# Patient Record
Sex: Female | Born: 1955 | Race: Black or African American | Hispanic: No | Marital: Single | Smoking: Never smoker
Health system: Southern US, Community
[De-identification: ages and names within clinical notes are randomized; demographics above are authoritative.]

## PROBLEM LIST (undated history)

## (undated) DIAGNOSIS — E119 Type 2 diabetes mellitus without complications: Secondary | ICD-10-CM

## (undated) DIAGNOSIS — K219 Gastro-esophageal reflux disease without esophagitis: Secondary | ICD-10-CM

## (undated) HISTORY — PX: ABDOMINAL HYSTERECTOMY: SHX81

---

## 2004-02-11 ENCOUNTER — Ambulatory Visit: Payer: Self-pay | Admitting: Family Medicine

## 2005-03-19 ENCOUNTER — Ambulatory Visit: Payer: Self-pay | Admitting: Family Medicine

## 2005-08-06 ENCOUNTER — Ambulatory Visit: Payer: Self-pay

## 2006-03-23 ENCOUNTER — Ambulatory Visit: Payer: Self-pay | Admitting: Family Medicine

## 2007-04-12 ENCOUNTER — Ambulatory Visit: Payer: Self-pay | Admitting: Family Medicine

## 2008-01-18 ENCOUNTER — Ambulatory Visit: Payer: Self-pay | Admitting: Unknown Physician Specialty

## 2008-05-01 ENCOUNTER — Ambulatory Visit: Payer: Self-pay | Admitting: Family Medicine

## 2009-06-11 ENCOUNTER — Ambulatory Visit: Payer: Self-pay

## 2010-06-20 ENCOUNTER — Ambulatory Visit: Payer: Self-pay | Admitting: Family Medicine

## 2010-07-04 ENCOUNTER — Ambulatory Visit: Payer: Self-pay | Admitting: Family Medicine

## 2011-02-17 ENCOUNTER — Ambulatory Visit: Payer: Self-pay | Admitting: Surgery

## 2011-06-22 ENCOUNTER — Ambulatory Visit: Payer: Self-pay | Admitting: Surgery

## 2011-12-30 ENCOUNTER — Ambulatory Visit: Payer: Self-pay | Admitting: Surgery

## 2013-01-02 ENCOUNTER — Ambulatory Visit: Payer: Self-pay | Admitting: Internal Medicine

## 2013-08-15 ENCOUNTER — Ambulatory Visit (INDEPENDENT_AMBULATORY_CARE_PROVIDER_SITE_OTHER): Payer: BC Managed Care – PPO

## 2013-08-15 ENCOUNTER — Ambulatory Visit (INDEPENDENT_AMBULATORY_CARE_PROVIDER_SITE_OTHER): Payer: BC Managed Care – PPO | Admitting: Podiatry

## 2013-08-15 ENCOUNTER — Encounter: Payer: Self-pay | Admitting: Podiatry

## 2013-08-15 VITALS — BP 157/84 | HR 73 | Resp 16 | Ht 63.0 in | Wt 153.0 lb

## 2013-08-15 DIAGNOSIS — M79609 Pain in unspecified limb: Secondary | ICD-10-CM

## 2013-08-15 DIAGNOSIS — B351 Tinea unguium: Secondary | ICD-10-CM

## 2013-08-15 DIAGNOSIS — M722 Plantar fascial fibromatosis: Secondary | ICD-10-CM

## 2013-08-15 MED ORDER — TRIAMCINOLONE ACETONIDE 10 MG/ML IJ SUSP
10.0000 mg | Freq: Once | INTRAMUSCULAR | Status: AC
  Administered 2013-08-15: 10 mg

## 2013-08-15 NOTE — Progress Notes (Signed)
Subjective:     Patient ID: Lebron Conners, female   DOB: 18-Dec-1955, 58 y.o.   MRN: [id number]  Foot Pain   patient states that she has very thick nailbeds 1-5 both feet that she cannot cut herself and she has problems with her right heel that's been very painful for the last month especially when she gets up in the morning and after periods of sitting   Review of Systems  All other systems reviewed and are negative.      Objective:   Physical Exam  Nursing note and vitals reviewed. Cardiovascular: Intact distal pulses.   Musculoskeletal: Normal range of motion.  Neurological: She is alert.  Skin: Skin is warm.   neurovascular status intact with muscle strength adequate and range of motion subtalar and midtarsal joint within normal limits. Patient has flatfoot deformity upon gait analysis and digits that are well perfused and is noted to have exquisite discomfort on the plantar aspect of the right heel. 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 Injected the right plantar fascia 3 mg Kenalog 5 mg Xylocaine Marcaine mixture and debridement nailbeds 1-5 both feet with no bleeding noted. In instructions on possible and probable orthotics for the long-term

## 2013-08-15 NOTE — Patient Instructions (Signed)

## 2013-08-15 NOTE — Progress Notes (Signed)
   Subjective:    Patient ID: Jill Heath, female    DOB: 1956-03-03, 58 y.o.   MRN: [id number]  HPI Comments: i have heel pain in my right foot and ive had it for 1 month. Its painful when i get up in the mornings. i soak my feet in epsom salt and vinegar. i want my toenails trimmed.  Foot Pain Associated symptoms include coughing.      Review of Systems  Constitutional: Positive for unexpected weight change.  HENT:       Ringing in ears  Respiratory: Positive for cough.   Gastrointestinal: Positive for constipation.  Neurological: Positive for dizziness.  Hematological: Bruises/bleeds easily.       Objective:   Physical Exam        Assessment & Plan:

## 2013-08-22 ENCOUNTER — Ambulatory Visit: Payer: BC Managed Care – PPO | Admitting: Podiatry

## 2013-09-08 ENCOUNTER — Encounter: Payer: Self-pay | Admitting: Podiatry

## 2013-09-08 ENCOUNTER — Ambulatory Visit (INDEPENDENT_AMBULATORY_CARE_PROVIDER_SITE_OTHER): Payer: BC Managed Care – PPO | Admitting: Podiatry

## 2013-09-08 DIAGNOSIS — B351 Tinea unguium: Secondary | ICD-10-CM

## 2013-09-08 DIAGNOSIS — M722 Plantar fascial fibromatosis: Secondary | ICD-10-CM

## 2013-09-08 NOTE — Progress Notes (Signed)
Subjective:     Patient ID: Jill ConnersNancy Marcus, female   DOB: 02/28/1956, 58 y.o.   MRN: [id number]  HPI patient states my heel pain is quite a bit better but I've had this problem for a long time and it seems like it comes and goes and I know my feet do not function well   Review of Systems     Objective:   Physical Exam Neurovascular status intact with diminished arch height and excessive E. version with discomfort still noted in the medial fascially band right but improved from previous visit    Assessment:     Improved plantar fasciitis in the acute nature but still has chronic condition with foot structure being a part of the problem    Plan:     H&P performed and at this time I have recommended orthotics and scanned for accomplished. He will reappoint when they are returned

## 2013-09-18 ENCOUNTER — Encounter: Payer: Self-pay | Admitting: Podiatry

## 2013-09-18 NOTE — Progress Notes (Signed)
Patients orthotics have arrived , patient has a return appointment scheduled

## 2013-09-29 ENCOUNTER — Ambulatory Visit (INDEPENDENT_AMBULATORY_CARE_PROVIDER_SITE_OTHER): Payer: BC Managed Care – PPO | Admitting: *Deleted

## 2013-09-29 DIAGNOSIS — M722 Plantar fascial fibromatosis: Secondary | ICD-10-CM

## 2013-09-29 NOTE — Progress Notes (Signed)
Pt presents for orthotic pick up written and verbal instructions are given 

## 2013-09-29 NOTE — Patient Instructions (Signed)

## 2014-02-16 ENCOUNTER — Ambulatory Visit (INDEPENDENT_AMBULATORY_CARE_PROVIDER_SITE_OTHER): Payer: BC Managed Care – PPO | Admitting: Podiatry

## 2014-02-16 DIAGNOSIS — M722 Plantar fascial fibromatosis: Secondary | ICD-10-CM

## 2014-02-16 MED ORDER — TRIAMCINOLONE ACETONIDE 10 MG/ML IJ SUSP
10.0000 mg | Freq: Once | INTRAMUSCULAR | Status: AC
  Administered 2014-02-16: 10 mg

## 2014-02-17 NOTE — Progress Notes (Signed)
Subjective:     Patient ID: Jill Heath, female   DOB: 04/29/1955, 58 y.o.   MRN: [id number]  HPI patient states my right heel has started to hurt me and I am unable to bear good weight on it and reminds me of other problems I'm   Review of Systems     Objective:   Physical Exam Neurovascular status intact with pain in the plantar aspect right heel at the insertional point of the tendon into the calcaneus with moderate depression of the arch    Assessment:     Plantar fasciitis right with inflammation at the insertion    Plan:     Injected the right plantar fascia 3 mg Kenalog 5 mg Xylocaine and dispensed fascial brace. Gave instructions on physical therapy shoe gear modification and reappoint in 10 days

## 2014-03-05 ENCOUNTER — Ambulatory Visit: Payer: Self-pay | Admitting: Internal Medicine

## 2015-05-31 ENCOUNTER — Other Ambulatory Visit: Payer: Self-pay | Admitting: Internal Medicine

## 2015-05-31 DIAGNOSIS — Z1231 Encounter for screening mammogram for malignant neoplasm of breast: Secondary | ICD-10-CM

## 2015-06-20 ENCOUNTER — Ambulatory Visit
Admission: RE | Admit: 2015-06-20 | Discharge: 2015-06-20 | Disposition: A | Discharge status: Home / Self Care | Payer: BLUE CROSS/BLUE SHIELD | Source: Ambulatory Visit | Attending: Internal Medicine | Admitting: Internal Medicine

## 2015-06-20 DIAGNOSIS — Z1231 Encounter for screening mammogram for malignant neoplasm of breast: Secondary | ICD-10-CM | POA: Diagnosis not present

## 2016-07-03 ENCOUNTER — Other Ambulatory Visit: Payer: Self-pay | Admitting: Internal Medicine

## 2016-07-24 ENCOUNTER — Other Ambulatory Visit: Payer: Self-pay | Admitting: Internal Medicine

## 2016-07-24 DIAGNOSIS — Z1231 Encounter for screening mammogram for malignant neoplasm of breast: Secondary | ICD-10-CM

## 2016-08-05 ENCOUNTER — Ambulatory Visit
Admission: RE | Admit: 2016-08-05 | Discharge: 2016-08-05 | Disposition: A | Discharge status: Home / Self Care | Payer: BLUE CROSS/BLUE SHIELD | Source: Ambulatory Visit | Attending: Internal Medicine | Admitting: Internal Medicine

## 2016-08-05 ENCOUNTER — Encounter: Payer: Self-pay | Admitting: Radiology

## 2016-08-05 DIAGNOSIS — Z1231 Encounter for screening mammogram for malignant neoplasm of breast: Secondary | ICD-10-CM | POA: Insufficient documentation

## 2017-06-21 ENCOUNTER — Other Ambulatory Visit: Payer: Self-pay | Admitting: Internal Medicine

## 2017-06-21 DIAGNOSIS — Z1231 Encounter for screening mammogram for malignant neoplasm of breast: Secondary | ICD-10-CM

## 2017-08-09 ENCOUNTER — Ambulatory Visit
Admission: RE | Admit: 2017-08-09 | Discharge: 2017-08-09 | Disposition: A | Discharge status: Home / Self Care | Payer: BLUE CROSS/BLUE SHIELD | Source: Ambulatory Visit | Attending: Internal Medicine | Admitting: Internal Medicine

## 2017-08-09 ENCOUNTER — Encounter (INDEPENDENT_AMBULATORY_CARE_PROVIDER_SITE_OTHER): Payer: Self-pay

## 2017-08-09 DIAGNOSIS — Z1231 Encounter for screening mammogram for malignant neoplasm of breast: Secondary | ICD-10-CM | POA: Diagnosis not present

## 2018-03-06 ENCOUNTER — Encounter: Payer: Self-pay | Admitting: Medical Oncology

## 2018-03-06 ENCOUNTER — Emergency Department: Payer: BLUE CROSS/BLUE SHIELD

## 2018-03-06 ENCOUNTER — Emergency Department
Admission: EM | Admit: 2018-03-06 | Discharge: 2018-03-06 | Disposition: A | Discharge status: Home / Self Care | Payer: BLUE CROSS/BLUE SHIELD | Attending: Emergency Medicine | Admitting: Emergency Medicine

## 2018-03-06 DIAGNOSIS — J209 Acute bronchitis, unspecified: Secondary | ICD-10-CM | POA: Diagnosis not present

## 2018-03-06 DIAGNOSIS — R05 Cough: Secondary | ICD-10-CM | POA: Diagnosis present

## 2018-03-06 MED ORDER — ALBUTEROL SULFATE 108 (90 BASE) MCG/ACT IN AEPB: 2.0000 | INHALATION_SPRAY | Freq: Four times a day (QID) | RESPIRATORY_TRACT | 0 refills | Status: DC

## 2018-03-06 MED ORDER — BENZONATATE 200 MG PO CAPS: 200.0000 mg | ORAL_CAPSULE | Freq: Three times a day (TID) | ORAL | 0 refills | Status: DC | PRN

## 2018-03-06 MED ORDER — METHYLPREDNISOLONE 4 MG PO TBPK: ORAL_TABLET | ORAL | 0 refills | Status: DC

## 2018-03-06 NOTE — ED Triage Notes (Signed)
Pt reports dry cough and "tickle" in her throat for over a month. Pt in NAD

## 2018-03-06 NOTE — Discharge Instructions (Addendum)
Follow-up with your regular doctor if not better in 4 to 5 days.  Return emergency department worsening.  Take medications as prescribed.

## 2018-03-06 NOTE — ED Provider Notes (Signed)
[facility] Emergency Department Provider Note  ____________________________________________   First MD Initiated Contact with Patient 03/06/18 1255     (approximate)  I have reviewed the triage vital signs and the nursing notes.   HISTORY  Chief Complaint Sore Throat and Cough    HPI Jill Heath is a 62 y.o. female presents emergency department complaining of cough and congestion with clear mucus.  Symptoms for 2 months.  Denies fever, chills, chest pain or shortness of breath.  Patient was given amoxicillin by her regular doctor which did not cure it.  She denies any chest pain or shortness of breath.   History reviewed. No pertinent past medical history.  There are no active problems to display for this patient.   Past Surgical History:  Procedure Laterality Date  . ABDOMINAL HYSTERECTOMY      Prior to Admission medications   Medication Sig Start Date End Date Taking? Authorizing Provider  Albuterol Sulfate (PROAIR RESPICLICK) 108 (90 Base) MCG/ACT AEPB Inhale 2 Inhalers into the lungs every 6 (six) hours. 03/06/18   Sherrie MustacheFisher, Roselyn BeringSusan W, PA-C  aspirin EC 81 MG tablet Take by mouth.    [provider]  benzonatate (TESSALON) 200 MG capsule Take 1 capsule (200 mg total) by mouth 3 (three) times daily as needed for cough. 03/06/18   Fisher, Roselyn BeringSusan W, PA-C  losartan-hydrochlorothiazide (HYZAAR) 100-25 MG per tablet Take by mouth.    [provider]  metFORMIN (GLUCOPHAGE) 500 MG tablet Take by mouth.    [provider]  methylPREDNISolone (MEDROL DOSEPAK) 4 MG TBPK tablet Take 6 pills on day one then decrease by 1 pill each day 03/06/18   Faythe GheeFisher, Susan W, PA-C  omeprazole (PRILOSEC) 40 MG capsule Take by mouth. 07/24/13 07/24/14  [provider]  rosuvastatin (CRESTOR) 20 MG tablet Take 20 mg by mouth daily.    [provider]    Allergies Patient has no known allergies.  Family History  Problem  Relation Age of Onset  . Breast cancer Neg Hx     Social History Social History   Tobacco Use  . Smoking status: Never Smoker  Substance Use Topics  . Alcohol use: No  . Drug use: Not on file    Review of Systems  Constitutional: No fever/chills Eyes: No visual changes. ENT: No sore throat. Respiratory: Positive cough and congestion, positive wheezing Genitourinary: Negative for dysuria. Musculoskeletal: Negative for back pain. Skin: Negative for rash.    ____________________________________________   PHYSICAL EXAM:  VITAL SIGNS: ED Triage Vitals  Enc Vitals Group     BP 03/06/18 1246 (!) 171/70     Pulse Rate 03/06/18 1246 73     Resp 03/06/18 1246 16     Temp 03/06/18 1246 98.3 F (36.8 C)     Temp Source 03/06/18 1246 Oral     SpO2 03/06/18 1246 97 %     Weight 03/06/18 1245 152 lb 1.9 oz (69 kg)     Height --      Head Circumference --      Peak Flow --      Pain Score 03/06/18 1245 0     Pain Loc --      Pain Edu? --      Excl. in GC? --     Constitutional: Alert and oriented. Well appearing and in no acute distress. Eyes: Conjunctivae are normal.  Head: Atraumatic. ENT: TMS clear bilaterally Nose: No congestion/rhinnorhea. Mouth/Throat: Mucous membranes are moist.  NECK: Is supple, no lymphadenopathy is noted  cardiovascular: Normal rate, regular rhythm.  Heart sounds are normal Respiratory: Normal respiratory effort.  No retractions,  clear to auscultation GU: deferred Musculoskeletal: FROM all extremities, warm and well perfused Neurologic:  Normal speech and language.  Skin:  Skin is warm, dry and intact. No rash noted. Psychiatric: Mood and affect are normal. Speech and behavior are normal.  ____________________________________________   LABS (all labs ordered are listed, but only abnormal results are displayed)  Labs Reviewed - No data to  display ____________________________________________   ____________________________________________  RADIOLOGY  Chest x-ray is normal  ____________________________________________   PROCEDURES  Procedure(s) performed: No  Procedures    ____________________________________________   INITIAL IMPRESSION / ASSESSMENT AND PLAN / ED COURSE  Pertinent labs & imaging results that were available during my care of the patient were reviewed by me and considered in my medical decision making (see chart for details).   Patient is a 62 year old female presents emergency department cough for 2 months  Physical exam patient appears well.  She does have a dry cough, lungs are clear to all station, remainder of exam is unremarkable  Chest x-ray is negative  Explained findings to the patient.  Told her that most bronchitis is are viral.  She is to follow-up with her regular doctor for a recheck in 1 week.  She was given a prescription for albuterol, Tessalon Perles, and a steroid pack.  She states she understands all of her instructions.  She was discharged stable condition.     As part of my medical decision making, I reviewed the following data within the electronic MEDICAL RECORD NUMBER Nursing notes reviewed and incorporated, Old chart reviewed, Radiograph reviewed chest x-ray is negative, Notes from prior ED visits and [place] Controlled Substance Database  ____________________________________________   FINAL CLINICAL IMPRESSION(S) / ED DIAGNOSES  Final diagnoses:  Acute bronchitis, unspecified organism      NEW MEDICATIONS STARTED DURING THIS VISIT:  Discharge Medication List as of 03/06/2018  1:48 PM    START taking these medications   Details  Albuterol Sulfate (PROAIR RESPICLICK) 108 (90 Base) MCG/ACT AEPB Inhale 2 Inhalers into the lungs every 6 (six) hours., Starting Sun 03/06/2018, Normal    benzonatate (TESSALON) 200 MG capsule Take 1 capsule (200 mg total) by mouth 3  (three) times daily as needed for cough., Starting Sun 03/06/2018, Normal    methylPREDNISolone (MEDROL DOSEPAK) 4 MG TBPK tablet Take 6 pills on day one then decrease by 1 pill each day, Normal         Note:  This document was prepared using Dragon voice recognition software and may include unintentional dictation errors.     Faythe GheeFisher, Susan W, PA-C 03/06/18 1611    Sharman CheekStafford, Phillip, MD 03/14/18 [phone]

## 2018-03-06 NOTE — ED Notes (Signed)
See triage note  Presents with dry cough  Also states she has had a tickle in her throat for several weeks   Afebrile on arrival

## 2018-09-22 ENCOUNTER — Other Ambulatory Visit: Payer: Self-pay | Admitting: Family Medicine

## 2018-09-22 ENCOUNTER — Other Ambulatory Visit: Payer: Self-pay | Admitting: Internal Medicine

## 2018-09-22 DIAGNOSIS — Z1231 Encounter for screening mammogram for malignant neoplasm of breast: Secondary | ICD-10-CM

## 2018-10-06 ENCOUNTER — Ambulatory Visit
Admission: RE | Admit: 2018-10-06 | Discharge: 2018-10-06 | Disposition: A | Discharge status: Home / Self Care | Payer: BC Managed Care – PPO | Source: Ambulatory Visit | Attending: Family Medicine | Admitting: Family Medicine

## 2018-10-06 ENCOUNTER — Encounter (INDEPENDENT_AMBULATORY_CARE_PROVIDER_SITE_OTHER): Payer: Self-pay

## 2018-10-06 ENCOUNTER — Other Ambulatory Visit: Payer: Self-pay

## 2018-10-06 DIAGNOSIS — Z1231 Encounter for screening mammogram for malignant neoplasm of breast: Secondary | ICD-10-CM | POA: Insufficient documentation

## 2019-03-30 IMAGING — MG MM DIGITAL SCREENING BILAT W/ TOMO W/ CAD
8 series · 8 of 24 positions shown · non-contrast
Comparison: Previous exam(s).

CLINICAL DATA: Screening.

EXAM:
DIGITAL SCREENING BILATERAL MAMMOGRAM WITH TOMO AND CAD

[R CC synth-2D]
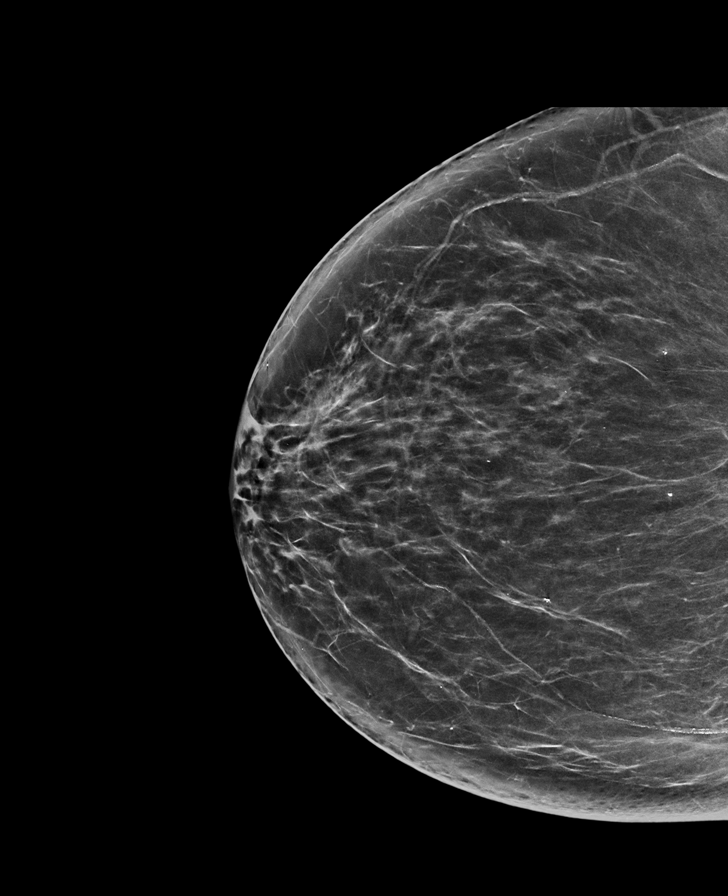

[L CC synth-2D]
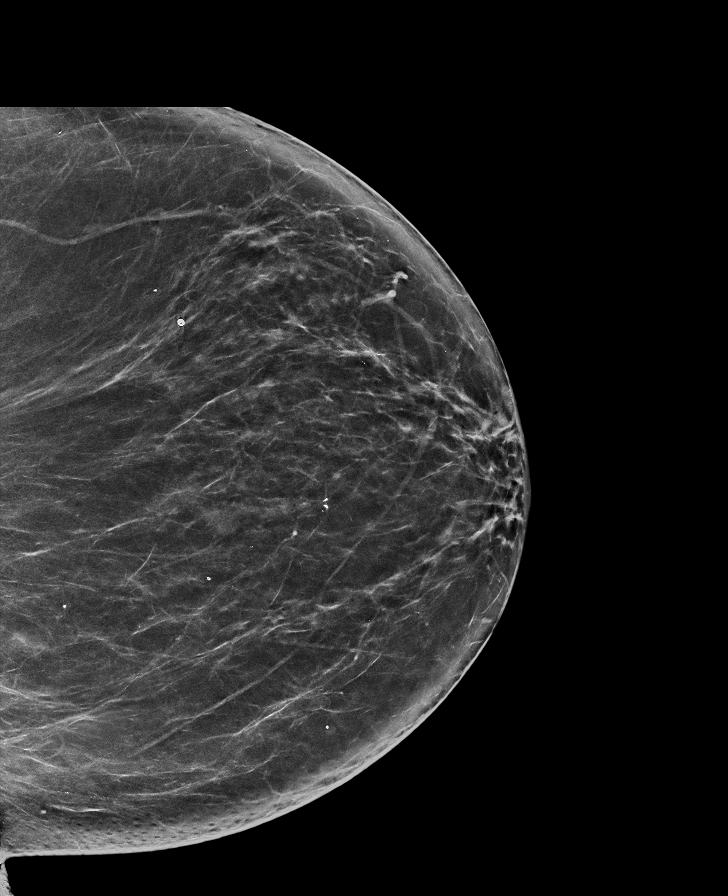

[R MLO synth-2D]
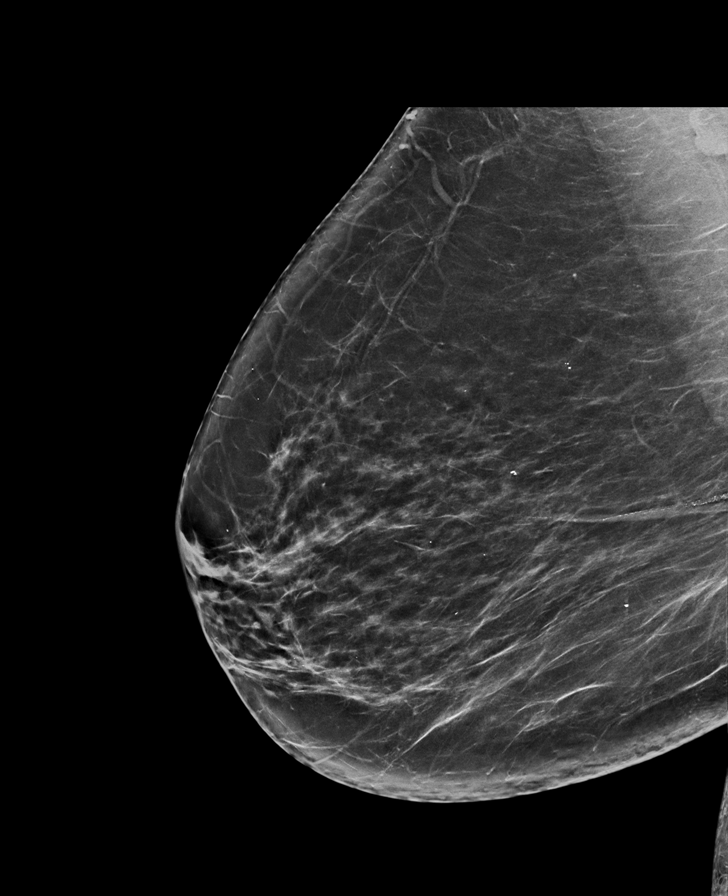

[L MLO synth-2D]
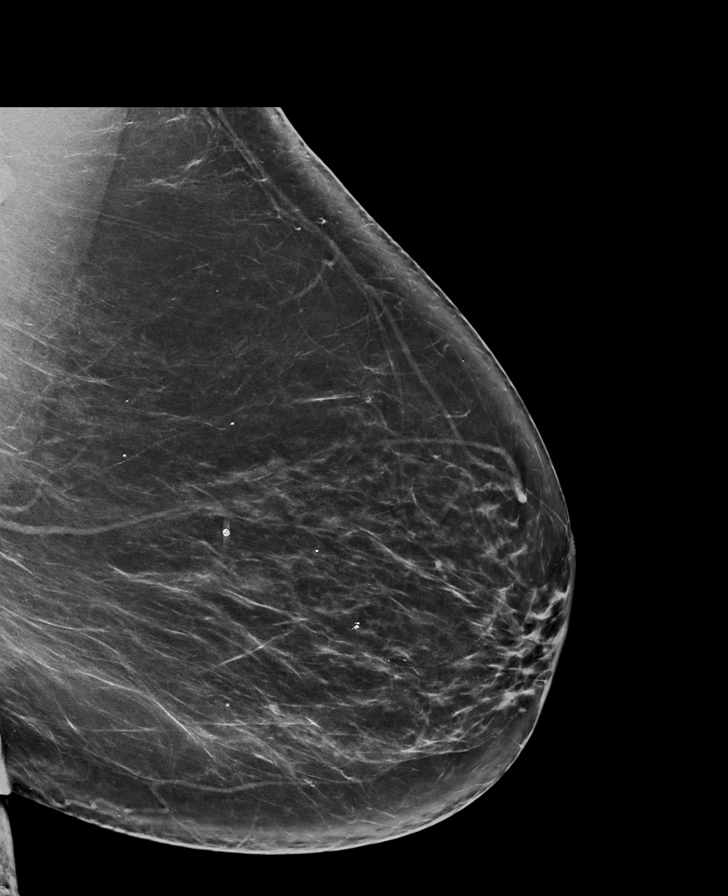

[L CC tomo · tomo slice 39/77.0]
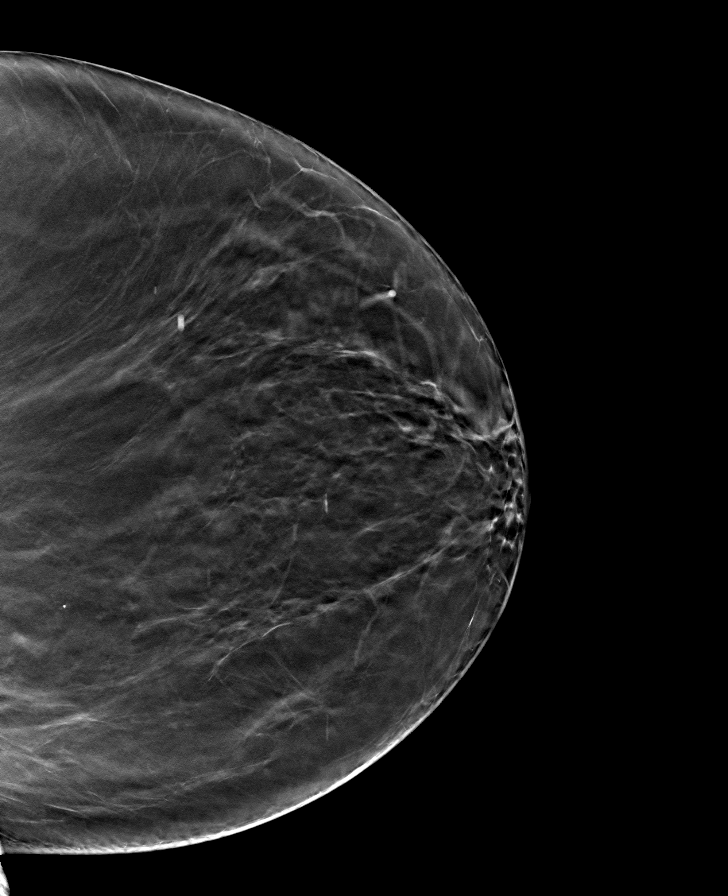

[R MLO tomo · tomo slice 43/86.0]
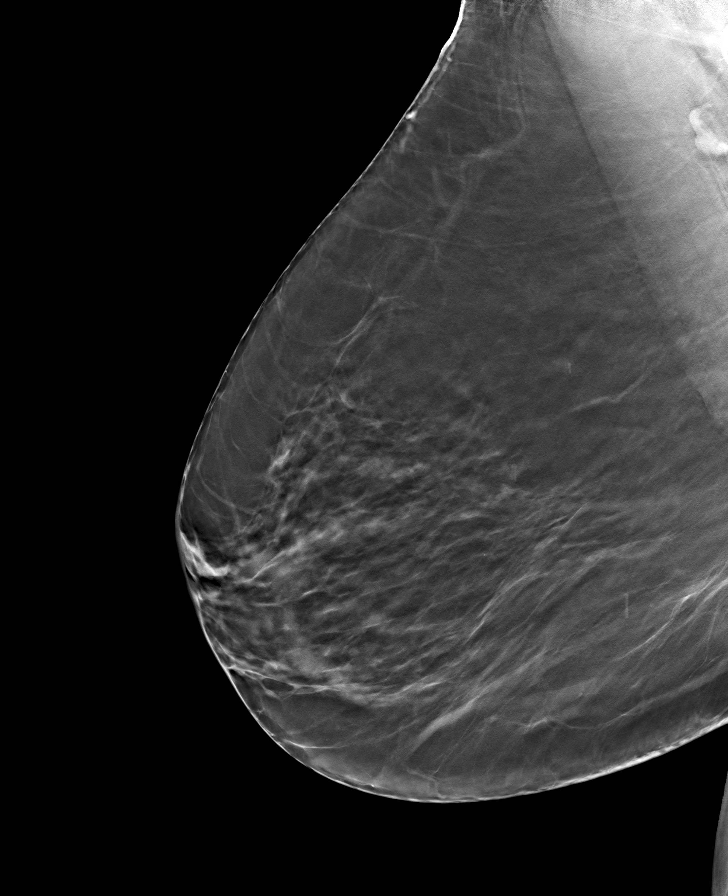

[R CC tomo · tomo slice 39/77.0]
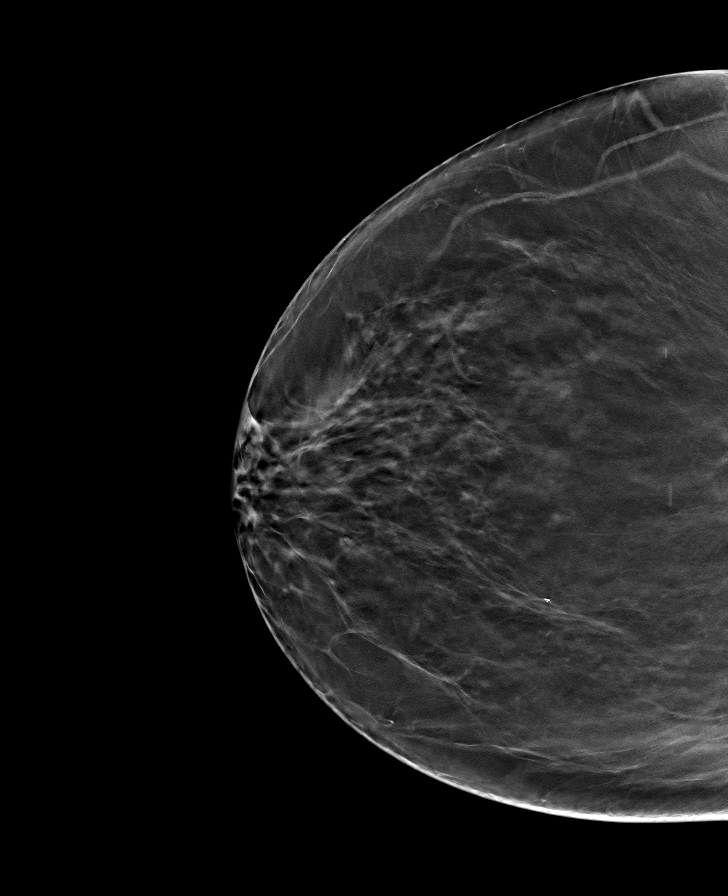

[L MLO tomo · tomo slice 45/89.0]
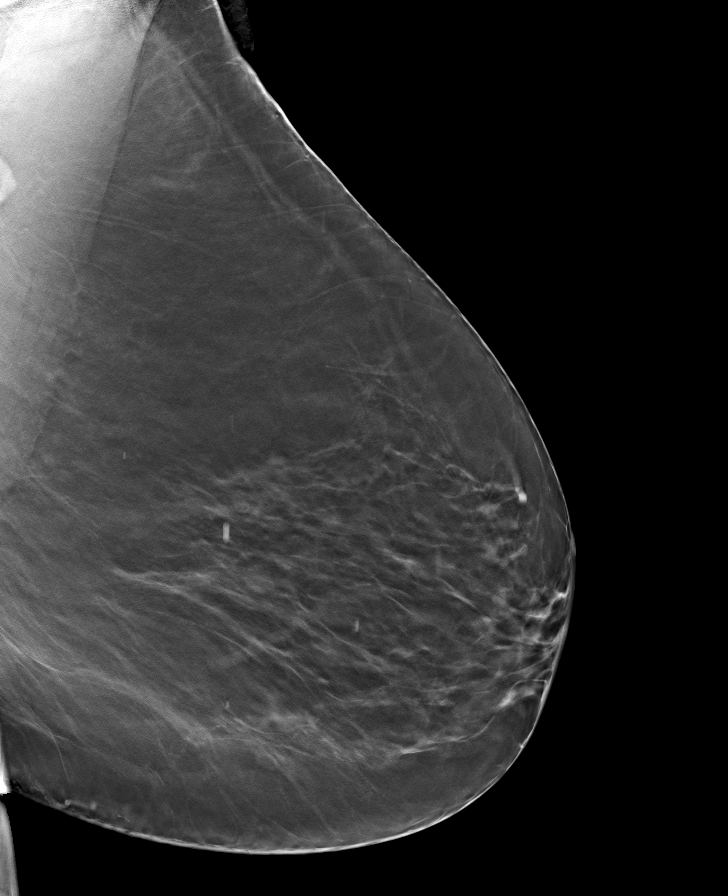

[8 of 24 positions shown; findings below may reference images not displayed]

ACR Breast Density Category b: There are scattered areas of
fibroglandular density.
FINDINGS: There are no findings suspicious for malignancy. Images were
processed with CAD.
IMPRESSION: No mammographic evidence of malignancy. A result letter of this
screening mammogram will be mailed directly to the patient.

RECOMMENDATION:
Screening mammogram in one year. (Code:[id number])

BI-RADS CATEGORY  1: Negative.

## 2019-04-05 ENCOUNTER — Other Ambulatory Visit: Payer: Self-pay | Admitting: Family Medicine

## 2019-04-05 DIAGNOSIS — Z1231 Encounter for screening mammogram for malignant neoplasm of breast: Secondary | ICD-10-CM

## 2019-10-09 ENCOUNTER — Ambulatory Visit: Payer: BC Managed Care – PPO

## 2019-10-16 ENCOUNTER — Ambulatory Visit
Admission: RE | Admit: 2019-10-16 | Discharge: 2019-10-16 | Disposition: A | Discharge status: Home / Self Care | Payer: PRIVATE HEALTH INSURANCE | Source: Ambulatory Visit | Attending: Family Medicine | Admitting: Family Medicine

## 2019-10-16 ENCOUNTER — Other Ambulatory Visit: Payer: Self-pay

## 2019-10-16 DIAGNOSIS — Z1231 Encounter for screening mammogram for malignant neoplasm of breast: Secondary | ICD-10-CM | POA: Insufficient documentation

## 2020-09-12 ENCOUNTER — Other Ambulatory Visit: Payer: Self-pay | Admitting: Family Medicine

## 2020-09-12 DIAGNOSIS — Z1231 Encounter for screening mammogram for malignant neoplasm of breast: Secondary | ICD-10-CM

## 2020-10-16 ENCOUNTER — Ambulatory Visit: Payer: No Typology Code available for payment source

## 2020-10-28 ENCOUNTER — Other Ambulatory Visit: Payer: Self-pay

## 2020-10-28 ENCOUNTER — Ambulatory Visit
Admission: RE | Admit: 2020-10-28 | Discharge: 2020-10-28 | Disposition: A | Discharge status: Home / Self Care | Payer: PRIVATE HEALTH INSURANCE | Source: Ambulatory Visit | Attending: Family Medicine | Admitting: Family Medicine

## 2020-10-28 DIAGNOSIS — Z1231 Encounter for screening mammogram for malignant neoplasm of breast: Secondary | ICD-10-CM | POA: Diagnosis not present

## 2021-01-07 ENCOUNTER — Other Ambulatory Visit (HOSPITAL_COMMUNITY): Payer: Self-pay | Admitting: Family Medicine

## 2021-01-07 ENCOUNTER — Other Ambulatory Visit: Payer: Self-pay | Admitting: Family Medicine

## 2021-01-07 DIAGNOSIS — R0989 Other specified symptoms and signs involving the circulatory and respiratory systems: Secondary | ICD-10-CM

## 2021-01-15 ENCOUNTER — Ambulatory Visit: Payer: No Typology Code available for payment source

## 2021-01-29 ENCOUNTER — Ambulatory Visit: Payer: No Typology Code available for payment source | Attending: Family Medicine

## 2021-09-25 ENCOUNTER — Other Ambulatory Visit: Payer: Self-pay | Admitting: Family Medicine

## 2021-09-25 DIAGNOSIS — Z1231 Encounter for screening mammogram for malignant neoplasm of breast: Secondary | ICD-10-CM

## 2021-10-31 ENCOUNTER — Ambulatory Visit
Admission: RE | Admit: 2021-10-31 | Discharge: 2021-10-31 | Disposition: A | Discharge status: Home / Self Care | Payer: No Typology Code available for payment source | Source: Ambulatory Visit | Attending: Family Medicine | Admitting: Family Medicine

## 2021-10-31 DIAGNOSIS — Z1231 Encounter for screening mammogram for malignant neoplasm of breast: Secondary | ICD-10-CM | POA: Diagnosis present

## 2022-06-08 ENCOUNTER — Encounter: Payer: Self-pay | Admitting: Ophthalmology

## 2022-06-11 NOTE — Anesthesia Preprocedure Evaluation (Addendum)
Anesthesia Evaluation  Patient identified by MRN, date of birth, ID band Patient awake    Reviewed: Allergy & Precautions, H&P , NPO status , Patient's Chart, lab work & pertinent test results  Airway Mallampati: II  TM Distance: >3 FB Neck ROM: Full    Dental  (+) Upper Dentures, Lower Dentures   Pulmonary neg pulmonary ROS   Pulmonary exam normal breath sounds clear to auscultation       Cardiovascular hypertension, negative cardio ROS Normal cardiovascular exam Rhythm:Regular Rate:Normal     Neuro/Psych negative neurological ROS  negative psych ROS   GI/Hepatic negative GI ROS, Neg liver ROS,GERD  ,,  Endo/Other  negative endocrine ROSdiabetes, Type 2, Oral Hypoglycemic Agents    Renal/GU negative Renal ROS  negative genitourinary   Musculoskeletal negative musculoskeletal ROS (+)    Abdominal   Peds negative pediatric ROS (+)  Hematology negative hematology ROS (+)   Anesthesia Other Findings Diabetes mellitus without complication GERD (gastroesophageal reflux disease)    Reproductive/Obstetrics negative OB ROS                             Anesthesia Physical Anesthesia Plan  ASA: 2  Anesthesia Plan: General   Post-op Pain Management:    Induction: Intravenous  PONV Risk Score and Plan:   Airway Management Planned: Natural Airway and Nasal Cannula  Additional Equipment:   Intra-op Plan:   Post-operative Plan:   Informed Consent: I have reviewed the patients History and Physical, chart, labs and discussed the procedure including the risks, benefits and alternatives for the proposed anesthesia with the patient or authorized representative who has indicated his/her understanding and acceptance.     Dental Advisory Given  Plan Discussed with: Anesthesiologist, CRNA and Surgeon  Anesthesia Plan Comments: (Patient consented for risks of anesthesia including but not  limited to:  - adverse reactions to medications - risk of airway placement if required - damage to eyes, teeth, lips or other oral mucosa - nerve damage due to positioning  - sore throat or hoarseness - Damage to heart, brain, nerves, lungs, other parts of body or loss of life  Patient voiced understanding.)       Anesthesia Quick Evaluation

## 2022-06-17 NOTE — Discharge Instructions (Signed)

## 2022-06-18 ENCOUNTER — Encounter: Payer: Self-pay | Admitting: Ophthalmology

## 2022-06-18 ENCOUNTER — Encounter
Admission: RE | Disposition: A | Discharge status: Home / Self Care | Payer: Self-pay | Source: Home / Self Care | Attending: Ophthalmology

## 2022-06-18 ENCOUNTER — Ambulatory Visit: Payer: No Typology Code available for payment source | Admitting: Anesthesiology

## 2022-06-18 ENCOUNTER — Ambulatory Visit
Admission: RE | Admit: 2022-06-18 | Discharge: 2022-06-18 | Disposition: A | Discharge status: Home / Self Care | Payer: No Typology Code available for payment source | Attending: Ophthalmology | Admitting: Ophthalmology

## 2022-06-18 ENCOUNTER — Other Ambulatory Visit: Payer: Self-pay

## 2022-06-18 DIAGNOSIS — I10 Essential (primary) hypertension: Secondary | ICD-10-CM | POA: Diagnosis not present

## 2022-06-18 DIAGNOSIS — K219 Gastro-esophageal reflux disease without esophagitis: Secondary | ICD-10-CM | POA: Insufficient documentation

## 2022-06-18 DIAGNOSIS — Z7984 Long term (current) use of oral hypoglycemic drugs: Secondary | ICD-10-CM | POA: Insufficient documentation

## 2022-06-18 DIAGNOSIS — H2512 Age-related nuclear cataract, left eye: Secondary | ICD-10-CM | POA: Insufficient documentation

## 2022-06-18 DIAGNOSIS — E1136 Type 2 diabetes mellitus with diabetic cataract: Secondary | ICD-10-CM | POA: Diagnosis present

## 2022-06-18 HISTORY — DX: Type 2 diabetes mellitus without complications: E11.9

## 2022-06-18 HISTORY — PX: CATARACT EXTRACTION W/PHACO: SHX586

## 2022-06-18 HISTORY — DX: Gastro-esophageal reflux disease without esophagitis: K21.9

## 2022-06-18 LAB — GLUCOSE, CAPILLARY: Glucose-Capillary: 128 mg/dL — ABNORMAL HIGH (ref 70–99)

## 2022-06-18 SURGERY — PHACOEMULSIFICATION, CATARACT, WITH IOL INSERTION
Anesthesia: General | Site: Eye | Laterality: Left

## 2022-06-18 MED ORDER — SIGHTPATH DOSE#1 BSS IO SOLN
INTRAOCULAR | Status: DC | PRN
  Administered 2022-06-18: 74 mL via OPHTHALMIC

## 2022-06-18 MED ORDER — ONDANSETRON HCL 4 MG/2ML IJ SOLN
4.0000 mg | Freq: Once | INTRAMUSCULAR | Status: AC
  Administered 2022-06-18: 4 mg via INTRAVENOUS

## 2022-06-18 MED ORDER — TRYPAN BLUE 0.06 % IO SOSY
PREFILLED_SYRINGE | INTRAOCULAR | Status: DC | PRN
  Administered 2022-06-18: .5 mL via INTRAOCULAR

## 2022-06-18 MED ORDER — FENTANYL CITRATE (PF) 100 MCG/2ML IJ SOLN
INTRAMUSCULAR | Status: DC | PRN
  Administered 2022-06-18 (×2): 50 ug via INTRAVENOUS

## 2022-06-18 MED ORDER — ARMC OPHTHALMIC DILATING DROPS
1.0000 | OPHTHALMIC | Status: DC | PRN
  Administered 2022-06-18 (×3): 1 via OPHTHALMIC

## 2022-06-18 MED ORDER — MIDAZOLAM HCL 2 MG/2ML IJ SOLN
INTRAMUSCULAR | Status: DC | PRN
  Administered 2022-06-18: 2 mg via INTRAVENOUS

## 2022-06-18 MED ORDER — MOXIFLOXACIN HCL 0.5 % OP SOLN
OPHTHALMIC | Status: DC | PRN
  Administered 2022-06-18: .2 mL via OPHTHALMIC

## 2022-06-18 MED ORDER — TETRACAINE HCL 0.5 % OP SOLN
1.0000 [drp] | OPHTHALMIC | Status: DC | PRN
  Administered 2022-06-18 (×3): 1 [drp] via OPHTHALMIC

## 2022-06-18 MED ORDER — SIGHTPATH DOSE#1 NA HYALUR & NA CHOND-NA HYALUR IO KIT
PACK | INTRAOCULAR | Status: DC | PRN
  Administered 2022-06-18: 1 via OPHTHALMIC

## 2022-06-18 MED ORDER — LIDOCAINE HCL (PF) 2 % IJ SOLN
INTRAOCULAR | Status: DC | PRN
  Administered 2022-06-18: 1 mL via INTRAOCULAR

## 2022-06-18 MED ORDER — BRIMONIDINE TARTRATE-TIMOLOL 0.2-0.5 % OP SOLN
OPHTHALMIC | Status: DC | PRN
  Administered 2022-06-18: 1 [drp] via OPHTHALMIC

## 2022-06-18 MED ORDER — DEXMEDETOMIDINE HCL IN NACL 200 MCG/50ML IV SOLN
INTRAVENOUS | Status: DC | PRN
  Administered 2022-06-18 (×2): 4 ug via INTRAVENOUS

## 2022-06-18 MED ORDER — BSS IO SOLN
INTRAOCULAR | Status: DC | PRN
  Administered 2022-06-18 (×2): 15 mL

## 2022-06-18 MED ORDER — SIGHTPATH DOSE#1 BSS IO SOLN
INTRAOCULAR | Status: DC | PRN
  Administered 2022-06-18: 15 mL

## 2022-06-18 SURGICAL SUPPLY — 22 items
BNDG EYE OVAL 2 1/8 X 2 5/8 (GAUZE/BANDAGES/DRESSINGS) IMPLANT
CANNULA ANT/CHMB 27G (MISCELLANEOUS) IMPLANT
CANNULA ANT/CHMB 27GA (MISCELLANEOUS) IMPLANT
CATARACT SUITE SIGHTPATH (MISCELLANEOUS) ×1 IMPLANT
DISSECTOR HYDRO NUCLEUS 50X22 (MISCELLANEOUS) ×1 IMPLANT
DRSG TEGADERM 2-3/8X2-3/4 SM (GAUZE/BANDAGES/DRESSINGS) ×1 IMPLANT
FEE CATARACT SUITE SIGHTPATH (MISCELLANEOUS) ×1 IMPLANT
GLOVE SURG SYN 7.5  E (GLOVE) ×1
GLOVE SURG SYN 7.5 E (GLOVE) ×1 IMPLANT
GLOVE SURG SYN 7.5 PF PI (GLOVE) ×1 IMPLANT
GLOVE SURG SYN 8.5  E (GLOVE) ×1
GLOVE SURG SYN 8.5 E (GLOVE) ×1 IMPLANT
GLOVE SURG SYN 8.5 PF PI (GLOVE) ×1 IMPLANT
LENS IOL TECNIS EYHANCE 25.0 (Intraocular Lens) IMPLANT
NDL FILTER BLUNT 18X1 1/2 (NEEDLE) IMPLANT
NEEDLE FILTER BLUNT 18X1 1/2 (NEEDLE) IMPLANT
PACK VIT ANT 23G (MISCELLANEOUS) IMPLANT
RING MALYGIN 7.0 (MISCELLANEOUS) IMPLANT
SUT ETHILON 10-0 CS-B-6CS-B-6 (SUTURE) IMPLANT
SUTURE EHLN 10-0 CS-B-6CS-B-6 (SUTURE) IMPLANT
SYR 3ML LL SCALE MARK (SYRINGE) IMPLANT
SYR 5ML LL (SYRINGE) IMPLANT

## 2022-06-18 NOTE — Op Note (Addendum)
OPERATIVE NOTE  Jill Heath [id number] 06/18/2022   PREOPERATIVE DIAGNOSIS: Nuclear sclerotic cataract left eye. H25.12   POSTOPERATIVE DIAGNOSIS: Nuclear sclerotic cataract left eye. H25.12   PROCEDURE:  Phacoemusification with posterior chamber intraocular lens placement of the left eye  Ultrasound time: Procedure(s): CATARACT EXTRACTION PHACO AND INTRAOCULAR LENS PLACEMENT (IOC) LEFT (Left)  LENS:   Implant Name Type Inv. Item Serial No. Manufacturer Lot No. LRB No. Used Action  LENS IOL TECNIS EYHANCE 25.0 - [id number] Intraocular Lens LENS IOL TECNIS EYHANCE 25.0 [id number] [employer]  Left 1 Implanted      SURGEON:  Julious Payer. Rolley Sims, MD   ANESTHESIA:  Topical with tetracaine drops, augmented with 1% preservative-free intracameral lidocaine.   COMPLICATIONS:  None.   DESCRIPTION OF PROCEDURE:  The patient was identified in the holding room and transported to the operating room and placed in the supine position under the operating microscope.  The left eye was identified as the operative eye, which was prepped and draped in the usual sterile ophthalmic fashion.   A 1 millimeter clear-corneal paracentesis was made inferotemporally. Preservative-free 1% lidocaine mixed with 1:1,000 bisulfite-free aqueous solution of epinephrine was injected into the anterior chamber. Due to diffuse, dense cortical spokes, red reflex was impaired so Trypan blue was injected intracamerally to stain the anterior capsule and facilitate safe creation of the capsulorrhexis. The anterior chamber was then filled with Viscoat viscoelastic. A 2.4 millimeter keratome was used to make a clear-corneal incision superotemporally. A curvilinear capsulorrhexis was made with a cystotome and capsulorrhexis forceps. Balanced salt solution was used to hydrodissect and hydrodelineate the nucleus. Phacoemulsification was then used to remove the lens nucleus and epinucleus. The remaining cortex was then removed using the  irrigation and aspiration handpiece. Provisc was then placed into the capsular bag to distend it for lens placement. A +25.00 D DIB00 intraocular lens was then injected into the capsular bag. The remaining viscoelastic was aspirated.   Wounds were hydrated with balanced salt solution.  The anterior chamber was inflated to a physiologic pressure with balanced salt solution.  No wound leaks were noted. Vigamox was injected intracamerally.  Timolol and Brimonidine drops were applied to the eye.  The patient was taken to the recovery room in stable condition without complications of anesthesia or surgery.  [name] 06/18/2022, 2:44 PM

## 2022-06-18 NOTE — Transfer of Care (Signed)
Immediate Anesthesia Transfer of Care Note  Patient: Jill Heath  Procedure(s) Performed: CATARACT EXTRACTION PHACO AND INTRAOCULAR LENS PLACEMENT (IOC) LEFT (Left: Eye)  Patient Location: PACU  Anesthesia Type: General  Level of Consciousness: awake, alert  and patient cooperative  Airway and Oxygen Therapy: Patient Spontanous Breathing and Patient connected to supplemental oxygen  Post-op Assessment: Post-op Vital signs reviewed, Patient's Cardiovascular Status Stable, Respiratory Function Stable, Patent Airway and No signs of Nausea or vomiting  Post-op Vital Signs: Reviewed and stable  Complications: No notable events documented.

## 2022-06-18 NOTE — H&P (Signed)
[facility]   Primary Care Physician:  Rayetta Humphrey, MD Ophthalmologist: Dr. Deberah Pelton  Pre-Procedure History & Physical: HPI:  Jill Heath is a 67 y.o. female here for cataract surgery.   Past Medical History:  Diagnosis Date   Diabetes mellitus without complication    GERD (gastroesophageal reflux disease)     Past Surgical History:  Procedure Laterality Date   ABDOMINAL HYSTERECTOMY      Prior to Admission medications   Medication Sig Start Date End Date Taking? Authorizing Provider  aspirin EC 81 MG tablet Take by mouth.   Yes [provider]  losartan-hydrochlorothiazide (HYZAAR) 100-25 MG per tablet Take by mouth.   Yes [provider]  metFORMIN (GLUCOPHAGE) 500 MG tablet Take by mouth.   Yes [provider]  rosuvastatin (CRESTOR) 20 MG tablet Take 20 mg by mouth daily.   Yes [provider]  Albuterol Sulfate (PROAIR RESPICLICK) 108 (90 Base) MCG/ACT AEPB Inhale 2 Inhalers into the lungs every 6 (six) hours. Patient not taking: Reported on 06/08/2022 03/06/18   Faythe Ghee, PA-C  benzonatate (TESSALON) 200 MG capsule Take 1 capsule (200 mg total) by mouth 3 (three) times daily as needed for cough. Patient not taking: Reported on 06/08/2022 03/06/18   Faythe Ghee, PA-C  methylPREDNISolone (MEDROL DOSEPAK) 4 MG TBPK tablet Take 6 pills on day one then decrease by 1 pill each day Patient not taking: Reported on 06/08/2022 03/06/18   Faythe Ghee, PA-C  omeprazole (PRILOSEC) 40 MG capsule Take by mouth. 07/24/13 07/24/14  [provider]    Allergies as of 05/20/2022   (No Known Allergies)    Family History  Problem Relation Age of Onset   Breast cancer Neg Hx     Social History   Socioeconomic History   Marital status: Single    Spouse name: Not on file   Number of children: Not on file   Years of education: Not on file   Highest education level: Not on file  Occupational History   Not on  file  Tobacco Use   Smoking status: Never   Smokeless tobacco: Not on file  Substance and Sexual Activity   Alcohol use: No   Drug use: Not on file   Sexual activity: Not on file  Other Topics Concern   Not on file  Social History Narrative   Not on file   Social Determinants of Health   Financial Resource Strain: Not on file  Food Insecurity: Not on file  Transportation Needs: Not on file  Physical Activity: Not on file  Stress: Not on file  Social Connections: Not on file  Intimate Partner Violence: Not on file    Review of Systems: See HPI, otherwise negative ROS  Physical Exam: Ht 5\' 3"  (1.6 m)   Wt 65.8 kg   BMI 25.69 kg/m  General:   Alert, cooperative in NAD Head:  Normocephalic and atraumatic. Respiratory:  Normal work of breathing. Cardiovascular:  RRR  Impression/Plan: Jill Heath is here for cataract surgery.  Risks, benefits, limitations, and alternatives regarding cataract surgery have been reviewed with the patient.  Questions have been answered.  All parties agreeable.   Jill Pandy, MD  06/18/2022, 11:28 AM

## 2022-06-18 NOTE — Anesthesia Postprocedure Evaluation (Signed)
Anesthesia Post Note  Patient: Jill Heath  Procedure(s) Performed: CATARACT EXTRACTION PHACO AND INTRAOCULAR LENS PLACEMENT (IOC) LEFT (Left: Eye)  Anesthesia Type: General Anesthetic complications: no   No notable events documented.   Last Vitals:  Vitals:   06/18/22 1445 06/18/22 1458  BP: (!) 175/84   Pulse: 68   Resp: 18 (!) 28  Temp: 36.6 C 36.6 C  SpO2: 98% 100%    Last Pain:  Vitals:   06/18/22 1458  TempSrc:   PainSc: 0-No pain                 Tina Temme C Lyfe Monger

## 2022-06-25 ENCOUNTER — Encounter: Payer: Self-pay | Admitting: Ophthalmology

## 2022-06-30 NOTE — Discharge Instructions (Signed)

## 2022-07-02 ENCOUNTER — Ambulatory Visit
Admission: RE | Admit: 2022-07-02 | Discharge: 2022-07-02 | Disposition: A | Discharge status: Home / Self Care | Payer: Medicare Other | Attending: Ophthalmology | Admitting: Ophthalmology

## 2022-07-02 ENCOUNTER — Encounter
Admission: RE | Disposition: A | Discharge status: Home / Self Care | Payer: Self-pay | Source: Home / Self Care | Attending: Ophthalmology

## 2022-07-02 ENCOUNTER — Ambulatory Visit: Payer: Medicare Other | Admitting: Anesthesiology

## 2022-07-02 ENCOUNTER — Encounter: Payer: Self-pay | Admitting: Ophthalmology

## 2022-07-02 ENCOUNTER — Other Ambulatory Visit: Payer: Self-pay

## 2022-07-02 DIAGNOSIS — E1136 Type 2 diabetes mellitus with diabetic cataract: Secondary | ICD-10-CM | POA: Diagnosis not present

## 2022-07-02 DIAGNOSIS — H2511 Age-related nuclear cataract, right eye: Secondary | ICD-10-CM | POA: Diagnosis present

## 2022-07-02 HISTORY — PX: CATARACT EXTRACTION W/PHACO: SHX586

## 2022-07-02 LAB — GLUCOSE, CAPILLARY: Glucose-Capillary: 130 mg/dL — ABNORMAL HIGH (ref 70–99)

## 2022-07-02 SURGERY — PHACOEMULSIFICATION, CATARACT, WITH IOL INSERTION
Anesthesia: Monitor Anesthesia Care | Site: Eye | Laterality: Right

## 2022-07-02 MED ORDER — ARMC OPHTHALMIC DILATING DROPS
1.0000 | OPHTHALMIC | Status: DC | PRN
  Administered 2022-07-02 (×3): 1 via OPHTHALMIC

## 2022-07-02 MED ORDER — SODIUM CHLORIDE 0.9% FLUSH
INTRAVENOUS | Status: DC | PRN
  Administered 2022-07-02: 10 mL via INTRAVENOUS

## 2022-07-02 MED ORDER — SIGHTPATH DOSE#1 NA HYALUR & NA CHOND-NA HYALUR IO KIT
PACK | INTRAOCULAR | Status: DC | PRN
  Administered 2022-07-02: 1 via OPHTHALMIC

## 2022-07-02 MED ORDER — LACTATED RINGERS IV SOLN: INTRAVENOUS | Status: DC

## 2022-07-02 MED ORDER — BRIMONIDINE TARTRATE-TIMOLOL 0.2-0.5 % OP SOLN
OPHTHALMIC | Status: DC | PRN
  Administered 2022-07-02: 1 [drp] via OPHTHALMIC

## 2022-07-02 MED ORDER — MIDAZOLAM HCL 2 MG/2ML IJ SOLN
INTRAMUSCULAR | Status: DC | PRN
  Administered 2022-07-02 (×2): 1 mg via INTRAVENOUS

## 2022-07-02 MED ORDER — SIGHTPATH DOSE#1 BSS IO SOLN
INTRAOCULAR | Status: DC | PRN
  Administered 2022-07-02: 15 mL

## 2022-07-02 MED ORDER — ONDANSETRON HCL 4 MG/2ML IJ SOLN
INTRAMUSCULAR | Status: DC | PRN
  Administered 2022-07-02: 4 mg via INTRAVENOUS

## 2022-07-02 MED ORDER — SIGHTPATH DOSE#1 BSS IO SOLN
INTRAOCULAR | Status: DC | PRN
  Administered 2022-07-02: 64 mL via OPHTHALMIC

## 2022-07-02 MED ORDER — LIDOCAINE HCL (PF) 2 % IJ SOLN
INTRAOCULAR | Status: DC | PRN
  Administered 2022-07-02: 1 mL via INTRAOCULAR

## 2022-07-02 MED ORDER — TETRACAINE HCL 0.5 % OP SOLN
1.0000 [drp] | OPHTHALMIC | Status: DC | PRN
  Administered 2022-07-02 (×3): 1 [drp] via OPHTHALMIC

## 2022-07-02 MED ORDER — MOXIFLOXACIN HCL 0.5 % OP SOLN
OPHTHALMIC | Status: DC | PRN
  Administered 2022-07-02: .2 mL via OPHTHALMIC

## 2022-07-02 SURGICAL SUPPLY — 11 items
CATARACT SUITE SIGHTPATH (MISCELLANEOUS) ×1 IMPLANT
DISSECTOR HYDRO NUCLEUS 50X22 (MISCELLANEOUS) ×1 IMPLANT
DRSG TEGADERM 2-3/8X2-3/4 SM (GAUZE/BANDAGES/DRESSINGS) ×1 IMPLANT
FEE CATARACT SUITE SIGHTPATH (MISCELLANEOUS) ×1 IMPLANT
GLOVE SURG SYN 7.5  E (GLOVE) ×1
GLOVE SURG SYN 7.5 E (GLOVE) ×1 IMPLANT
GLOVE SURG SYN 7.5 PF PI (GLOVE) ×1 IMPLANT
GLOVE SURG SYN 8.5  E (GLOVE) ×1
GLOVE SURG SYN 8.5 E (GLOVE) ×1 IMPLANT
GLOVE SURG SYN 8.5 PF PI (GLOVE) ×1 IMPLANT
LENS IOL TECNIS EYHANCE 25.0 (Intraocular Lens) IMPLANT

## 2022-07-02 NOTE — Transfer of Care (Signed)
Immediate Anesthesia Transfer of Care Note  Patient: Jill Heath  Procedure(s) Performed: CATARACT EXTRACTION PHACO AND INTRAOCULAR LENS PLACEMENT (IOC) RIGHT  3.41  00:27.5 (Right: Eye)  Patient Location: PACU  Anesthesia Type: MAC  Level of Consciousness: awake, alert  and patient cooperative  Airway and Oxygen Therapy: Patient Spontanous Breathing and Patient connected to supplemental oxygen  Post-op Assessment: Post-op Vital signs reviewed, Patient's Cardiovascular Status Stable, Respiratory Function Stable, Patent Airway and No signs of Nausea or vomiting  Post-op Vital Signs: Reviewed and stable  Complications: No notable events documented.

## 2022-07-02 NOTE — Anesthesia Procedure Notes (Signed)
Procedure Name: MAC Date/Time: 07/02/2022 1:01 PM  Performed by: Lily Lovings, CRNAPre-anesthesia Checklist: Patient identified, Emergency Drugs available, Suction available, Patient being monitored and Timeout performed Patient Re-evaluated:Patient Re-evaluated prior to induction Oxygen Delivery Method: Nasal cannula Preoxygenation: Pre-oxygenation with 100% oxygen Induction Type: IV induction

## 2022-07-02 NOTE — Anesthesia Preprocedure Evaluation (Signed)
Anesthesia Evaluation  Patient identified by MRN, date of birth, ID band Patient awake    Reviewed: Allergy & Precautions, H&P , NPO status , Patient's Chart, lab work & pertinent test results  Airway Mallampati: III  TM Distance: >3 FB Neck ROM: Full    Dental  (+) Upper Dentures, Lower Dentures   Pulmonary neg pulmonary ROS   Pulmonary exam normal breath sounds clear to auscultation       Cardiovascular negative cardio ROS Normal cardiovascular exam Rhythm:Regular Rate:Normal     Neuro/Psych negative neurological ROS  negative psych ROS   GI/Hepatic negative GI ROS, Neg liver ROS,,,  Endo/Other  negative endocrine ROSdiabetes    Renal/GU negative Renal ROS  negative genitourinary   Musculoskeletal negative musculoskeletal ROS (+)    Abdominal   Peds negative pediatric ROS (+)  Hematology negative hematology ROS (+)   Anesthesia Other Findings   Reproductive/Obstetrics negative OB ROS                             Anesthesia Physical Anesthesia Plan  ASA: 2  Anesthesia Plan: MAC   Post-op Pain Management:    Induction: Intravenous  PONV Risk Score and Plan:   Airway Management Planned: Natural Airway and Nasal Cannula  Additional Equipment:   Intra-op Plan:   Post-operative Plan:   Informed Consent: I have reviewed the patients History and Physical, chart, labs and discussed the procedure including the risks, benefits and alternatives for the proposed anesthesia with the patient or authorized representative who has indicated his/her understanding and acceptance.     Dental Advisory Given  Plan Discussed with: Anesthesiologist, CRNA and Surgeon  Anesthesia Plan Comments: (Patient consented for risks of anesthesia including but not limited to:  - adverse reactions to medications - damage to eyes, teeth, lips or other oral mucosa - nerve damage due to positioning  -  sore throat or hoarseness - Damage to heart, brain, nerves, lungs, other parts of body or loss of life  Patient voiced understanding.)       Anesthesia Quick Evaluation

## 2022-07-02 NOTE — Anesthesia Postprocedure Evaluation (Signed)
Anesthesia Post Note  Patient: Dairl Ponder Giampietro  Procedure(s) Performed: CATARACT EXTRACTION PHACO AND INTRAOCULAR LENS PLACEMENT (IOC) RIGHT  3.41  00:27.5 (Right: Eye)  Patient location during evaluation: PACU Anesthesia Type: MAC Level of consciousness: awake and alert Pain management: pain level controlled Vital Signs Assessment: post-procedure vital signs reviewed and stable Respiratory status: spontaneous breathing, nonlabored ventilation, respiratory function stable and patient connected to nasal cannula oxygen Cardiovascular status: blood pressure returned to baseline and stable Postop Assessment: no apparent nausea or vomiting Anesthetic complications: no   No notable events documented.   Last Vitals:  Vitals:   07/02/22 1336 07/02/22 1337  BP:  (!) 144/75  Pulse: 60 (!) 57  Resp: 19 14  Temp:    SpO2: 100% 98%    Last Pain:  Vitals:   07/02/22 1333  PainSc: 0-No pain                 Ramir Malerba C Maty Zeisler

## 2022-07-02 NOTE — H&P (Signed)
[facility]   Primary Care Physician:  Rayetta Humphrey, MD Ophthalmologist: Dr. Deberah Pelton  Pre-Procedure History & Physical: HPI:  Jill Heath is a 67 y.o. female here for cataract surgery.   Past Medical History:  Diagnosis Date   Diabetes mellitus without complication    GERD (gastroesophageal reflux disease)     Past Surgical History:  Procedure Laterality Date   ABDOMINAL HYSTERECTOMY     CATARACT EXTRACTION W/PHACO Left 06/18/2022   Procedure: CATARACT EXTRACTION PHACO AND INTRAOCULAR LENS PLACEMENT (IOC) LEFT;  Surgeon: Estanislado Pandy, MD;  Location: [facility];  Service: Ophthalmology;  Laterality: Left;  3.98 0:32.3    Prior to Admission medications   Medication Sig Start Date End Date Taking? Authorizing Provider  amLODipine (NORVASC) 10 MG tablet Take 10 mg by mouth daily.   Yes [provider]  aspirin EC 81 MG tablet Take by mouth.   Yes [provider]  atorvastatin (LIPITOR) 80 MG tablet Take 80 mg by mouth daily.   Yes [provider]  Calcium-Vit D-Arg-Inos-Silicon (BONE DENSITY PO) Take by mouth daily.   Yes [provider]  ferrous sulfate 325 (65 FE) MG tablet Take 325 mg by mouth daily with breakfast.   Yes [provider]  glimepiride (AMARYL) 4 MG tablet Take 4 mg by mouth daily with breakfast.   Yes [provider]  metFORMIN (GLUCOPHAGE) 500 MG tablet Take 1,000 mg by mouth daily with breakfast.   Yes [provider]  omeprazole (PRILOSEC) 40 MG capsule Take by mouth. 07/24/13 06/25/22 Yes [provider]    Allergies as of 05/20/2022   (No Known Allergies)    Family History  Problem Relation Age of Onset   Breast cancer Neg Hx     Social History   Socioeconomic History   Marital status: Single    Spouse name: Not on file   Number of children: Not on file   Years of education: Not on file   Highest education level: Not on file   Occupational History   Not on file  Tobacco Use   Smoking status: Never   Smokeless tobacco: Never  Vaping Use   Vaping Use: Never used  Substance and Sexual Activity   Alcohol use: No   Drug use: Not on file   Sexual activity: Not on file  Other Topics Concern   Not on file  Social History Narrative   Not on file   Social Determinants of Health   Financial Resource Strain: Not on file  Food Insecurity: Not on file  Transportation Needs: Not on file  Physical Activity: Not on file  Stress: Not on file  Social Connections: Not on file  Intimate Partner Violence: Not on file    Review of Systems: See HPI, otherwise negative ROS  Physical Exam: Ht  (1.6 m)   Wt 66.7 kg   BMI 26.04 kg/m  General:   Alert, cooperative in NAD Head:  Normocephalic and atraumatic. Respiratory:  Normal work of breathing. Cardiovascular:  RRR  Impression/Plan: Jill Heath is here for cataract surgery.  Risks, benefits, limitations, and alternatives regarding cataract surgery have been reviewed with the patient.  Questions have been answered.  All parties agreeable.   Estanislado Pandy, MD  07/02/2022, 11:37 AM

## 2022-07-02 NOTE — Op Note (Signed)
OPERATIVE NOTE  Jill Heath [id number] 07/02/2022   PREOPERATIVE DIAGNOSIS: Nuclear sclerotic cataract right eye. H25.11   POSTOPERATIVE DIAGNOSIS: Nuclear sclerotic cataract right eye. H25.11   PROCEDURE:  Phacoemusification with posterior chamber intraocular lens placement of the right eye  Ultrasound time: Procedure(s) with comments: CATARACT EXTRACTION PHACO AND INTRAOCULAR LENS PLACEMENT (IOC) RIGHT  3.41  00:27.5 (Right) - Diabetic  LENS:   Implant Name Type Inv. Item Serial No. Manufacturer Lot No. LRB No. Used Action  LENS IOL TECNIS EYHANCE 25.0 - [id number] Intraocular Lens LENS IOL TECNIS EYHANCE 25.0 [id number] [employer]  Right 1 Implanted      SURGEON:  Julious Payer. Rolley Sims, MD   ANESTHESIA:  Topical with tetracaine drops, augmented with 1% preservative-free intracameral lidocaine.   COMPLICATIONS:  None.   DESCRIPTION OF PROCEDURE:  The patient was identified in the holding room and transported to the operating room and placed in the supine position under the operating microscope.  The right eye was identified as the operative eye, which was prepped and draped in the usual sterile ophthalmic fashion.   A 1 millimeter clear-corneal paracentesis was made superotemporally. Preservative-free 1% lidocaine mixed with 1:1,000 bisulfite-free aqueous solution of epinephrine was injected into the anterior chamber. The anterior chamber was then filled with Viscoat viscoelastic. A 2.4 millimeter keratome was used to make a clear-corneal incision inferotemporally. A curvilinear capsulorrhexis was made with a cystotome and capsulorrhexis forceps. Balanced salt solution was used to hydrodissect and hydrodelineate the nucleus. Phacoemulsification was then used to remove the lens nucleus and epinucleus. The remaining cortex was then removed using the irrigation and aspiration handpiece. Provisc was then placed into the capsular bag to distend it for lens placement. A +25.00 D DIB00  intraocular lens was then injected into the capsular bag. The remaining viscoelastic was aspirated.   Wounds were hydrated with balanced salt solution.  The anterior chamber was inflated to a physiologic pressure with balanced salt solution.  No wound leaks were noted. Vigamox was injected intracamerally.  Timolol and Brimonidine drops were applied to the eye.  The patient was taken to the recovery room in stable condition without complications of anesthesia or surgery.  [name] [place] 07/02/2022, 1:31 PM

## 2022-07-03 ENCOUNTER — Encounter: Payer: Self-pay | Admitting: Ophthalmology

## 2022-07-10 ENCOUNTER — Other Ambulatory Visit: Payer: Self-pay

## 2022-07-10 DIAGNOSIS — Z780 Asymptomatic menopausal state: Secondary | ICD-10-CM

## 2022-08-21 ENCOUNTER — Other Ambulatory Visit: Payer: Self-pay | Admitting: Family Medicine

## 2022-08-21 DIAGNOSIS — Z1231 Encounter for screening mammogram for malignant neoplasm of breast: Secondary | ICD-10-CM

## 2022-10-22 ENCOUNTER — Other Ambulatory Visit: Payer: No Typology Code available for payment source

## 2022-11-05 ENCOUNTER — Other Ambulatory Visit: Payer: No Typology Code available for payment source

## 2022-11-05 ENCOUNTER — Ambulatory Visit
Admission: RE | Admit: 2022-11-05 | Discharge: 2022-11-05 | Disposition: A | Discharge status: Home / Self Care | Payer: Medicare Other | Source: Ambulatory Visit | Attending: Family Medicine | Admitting: Family Medicine

## 2022-11-05 DIAGNOSIS — Z1231 Encounter for screening mammogram for malignant neoplasm of breast: Secondary | ICD-10-CM | POA: Insufficient documentation

## 2022-12-02 ENCOUNTER — Ambulatory Visit
Admission: RE | Admit: 2022-12-02 | Discharge: 2022-12-02 | Disposition: A | Discharge status: Home / Self Care | Payer: Medicare Other | Source: Ambulatory Visit | Attending: Family Medicine | Admitting: Family Medicine

## 2022-12-02 DIAGNOSIS — Z780 Asymptomatic menopausal state: Secondary | ICD-10-CM | POA: Insufficient documentation

## 2023-06-25 ENCOUNTER — Encounter: Payer: Self-pay | Admitting: Family Medicine

## 2023-06-25 DIAGNOSIS — R634 Abnormal weight loss: Secondary | ICD-10-CM

## 2023-06-25 DIAGNOSIS — I10 Essential (primary) hypertension: Secondary | ICD-10-CM

## 2023-09-28 ENCOUNTER — Other Ambulatory Visit: Payer: Self-pay | Admitting: Family Medicine

## 2023-09-28 DIAGNOSIS — Z1231 Encounter for screening mammogram for malignant neoplasm of breast: Secondary | ICD-10-CM

## 2023-11-09 ENCOUNTER — Ambulatory Visit
Admission: RE | Admit: 2023-11-09 | Discharge: 2023-11-09 | Disposition: A | Discharge status: Home / Self Care | Source: Ambulatory Visit | Attending: Family Medicine | Admitting: Family Medicine

## 2023-11-09 DIAGNOSIS — Z1231 Encounter for screening mammogram for malignant neoplasm of breast: Secondary | ICD-10-CM | POA: Insufficient documentation

## 2024-02-08 ENCOUNTER — Encounter: Payer: Self-pay | Admitting: Family Medicine

## 2024-02-08 DIAGNOSIS — R634 Abnormal weight loss: Secondary | ICD-10-CM

## 2024-03-13 ENCOUNTER — Encounter: Payer: Self-pay | Admitting: Family Medicine

## 2024-03-13 DIAGNOSIS — R634 Abnormal weight loss: Secondary | ICD-10-CM

## 2024-03-14 ENCOUNTER — Encounter: Payer: Self-pay | Admitting: Family Medicine

## 2024-03-14 DIAGNOSIS — R634 Abnormal weight loss: Secondary | ICD-10-CM

## 2024-03-14 DIAGNOSIS — I10 Essential (primary) hypertension: Secondary | ICD-10-CM

## 2024-04-10 ENCOUNTER — Encounter: Payer: Self-pay | Admitting: Family Medicine

## 2024-04-10 DIAGNOSIS — R634 Abnormal weight loss: Secondary | ICD-10-CM
